# Patient Record
Sex: Female | Born: 2000 | Race: Asian | Hispanic: No | Marital: Single | Smoking: Never smoker
Health system: Southern US, Community
[De-identification: ages and names within clinical notes are randomized; demographics above are authoritative.]

## PROBLEM LIST (undated history)

## (undated) DIAGNOSIS — F4310 Post-traumatic stress disorder, unspecified: Secondary | ICD-10-CM

## (undated) DIAGNOSIS — G4700 Insomnia, unspecified: Secondary | ICD-10-CM

## (undated) HISTORY — PX: NO PAST SURGERIES: SHX2092

---
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
## 2021-07-02 ENCOUNTER — Inpatient Hospital Stay (HOSPITAL_COMMUNITY)
Admission: AD | Admit: 2021-07-02 | Discharge: 2021-07-02 | Disposition: A | Discharge status: Home / Self Care | Payer: Commercial Managed Care - HMO | Attending: Family Medicine | Admitting: Family Medicine

## 2021-07-02 ENCOUNTER — Encounter (HOSPITAL_COMMUNITY): Payer: Self-pay | Admitting: Family Medicine

## 2021-07-02 ENCOUNTER — Other Ambulatory Visit: Payer: Self-pay

## 2021-07-02 DIAGNOSIS — Z3A11 11 weeks gestation of pregnancy: Secondary | ICD-10-CM | POA: Insufficient documentation

## 2021-07-02 DIAGNOSIS — O210 Mild hyperemesis gravidarum: Secondary | ICD-10-CM | POA: Diagnosis present

## 2021-07-02 DIAGNOSIS — O219 Vomiting of pregnancy, unspecified: Secondary | ICD-10-CM

## 2021-07-02 LAB — URINALYSIS, ROUTINE W REFLEX MICROSCOPIC
Bilirubin Urine: NEGATIVE
Glucose, UA: NEGATIVE mg/dL
Hgb urine dipstick: NEGATIVE
Ketones, ur: NEGATIVE mg/dL
Nitrite: NEGATIVE
Protein, ur: NEGATIVE mg/dL
Specific Gravity, Urine: 1.015 (ref 1.005–1.030)
pH: 6 (ref 5.0–8.0)

## 2021-07-02 LAB — WET PREP, GENITAL
Sperm: NONE SEEN
Trich, Wet Prep: NONE SEEN
WBC, Wet Prep HPF POC: >=10 — AB (ref <10)
Yeast Wet Prep HPF POC: NONE SEEN

## 2021-07-02 LAB — POCT PREGNANCY, URINE: Preg Test, Ur: POSITIVE — AB

## 2021-07-02 MED ORDER — METOCLOPRAMIDE HCL 10 MG PO TABS
10.0000 mg | ORAL_TABLET | Freq: Once | ORAL | Status: AC
  Administered 2021-07-02: 10 mg via ORAL
  Filled 2021-07-02: qty 1

## 2021-07-02 MED ORDER — METOCLOPRAMIDE HCL 10 MG PO TABS: 10.0000 mg | ORAL_TABLET | Freq: Three times a day (TID) | ORAL | 0 refills | Status: AC | PRN

## 2021-07-02 NOTE — MAU Note (Signed)
Regina Sanchez is a 21 y.o. at [redacted]w[redacted]d here in MAU reporting: has been feeling nauseated and having some emesis at night ongoing since February. Is now having some headaches, none currently. Intermittent lower abdominal discomfort, none currently. No bleeding or discharge. ? ?LMP: 04/13/2021 ? ?Onset of complaint: ongoing ? ?Pain score: 0/10 ? ?Vitals:  ? 07/02/21 1731  ?BP: 123/72  ?Pulse: 70  ?Resp: 16  ?Temp: 98.5 ?F (36.9 ?C)  ?SpO2: 98%  ?   ?FHT:172 ? ?Lab orders placed from triage: ua, upt ? ?

## 2021-07-02 NOTE — Discharge Instructions (Signed)
?  [facility]  ? ? ? ? ? ?   ?[facility]  [address], [place], [place] [address]  [phone]  ?[facility]  [address], [place], [place] [address]  [phone]  ?[facility]  [address], [place], [place] [address]  [phone]  ?[facility] [place] [facility]  [address], [place], [place] [address]  [phone]  ?[facility]  [address]), [place], [place] [address]  [phone]  ?[facility]  [address], [place], [place] [address]  [phone]  ?[facility]  [address], [place], [place] [address]  [phone]  ?[facility]  [address], [place], [place] [address]  [phone]  ?[facility]  [address], [place], [place] [address]  [phone]  ?[facility]  [address], [place], [place] [address]  [phone]  ?[facility]  [address], [place], [place] [address]  [phone]  ?[facility]   [address], [place], [place] [address]  [phone]  ?[facility]  [address], [place], [place] [address]   [phone]  ?[facility]  [address], , [place] [address]  [phone]  ? ? ? ? ? ? ?Safe Medications in Pregnancy  ? ?Acne: ?Benzoyl Peroxide ?Salicylic Acid ? ?Backache/Headache: ?Tylenol: 2 regular strength every 4 hours OR ?             2 Extra strength every 6 hours ? ?Colds/Coughs/Allergies: ?Benadryl (alcohol free) 25 mg every 6 hours as needed ?Breath right strips ?Claritin ?Cepacol throat lozenges ?Chloraseptic throat spray ?Cold-Eeze- up to three times per day ?Cough drops, alcohol free ?Flonase (by prescription only) ?Guaifenesin ?Mucinex ?Robitussin DM (plain only, alcohol free) ?Saline nasal  spray/drops ?Sudafed (pseudoephedrine) & Actifed ** use only after [redacted] weeks gestation and if you do not have high blood pressure ?Tylenol ?Vicks Vaporub ?Zinc lozenges ?Zyrtec  ? ?Constipation: ?Colace ?Ducolax suppositories ?Fleet enema ?Glycerin suppositories ?Metamucil ?Milk of magnesia ?Miralax ?Senokot ?Smooth move tea ? ?Diarrhea: ?Kaopectate ?Imodium A-D ? ?*NO pepto Bismol ? ?Hemorrhoids: ?Anusol ?Anusol HC ?Preparation H ?Tucks ? ?Indigestion: ?Tums ?Maalox ?Mylanta ?Zantac  ?Pepcid ? ?Insomnia: ?Benadryl (alcohol free) 25mg every 6 hours as needed ?Tylenol PM ?Unisom, no Gelcaps ? ?Leg Cramps: ?Tums ?MagGel ? ?Nausea/Vomiting:  ?Bonine ?Dramamine ?Emetrol ?Ginger extract ?Sea bands ?Meclizine  ?Nausea medication to take during pregnancy:  ?Unisom (doxylamine succinate 25 mg tablets) Take one tablet daily at bedtime. If symptoms are not adequately controlled, the dose can be increased to a maximum recommended dose of two tablets daily (1/2 tablet in the morning, 1/2 tablet mid-afternoon and one at bedtime). ?Vitamin B6 100mg tablets. Take one tablet twice a day (up to 200 mg per day). ? ?Skin Rashes: ?Aveeno products ?Benadryl cream or 25mg every 6 hours as needed ?Calamine Lotion ?1% cortisone cream ? ?Yeast infection: ?Gyne-lotrimin 7 ?Monistat 7 ? ?Gum/tooth pain: ?Anbesol ? ?**If taking multiple medications, please check labels to avoid duplicating the same active ingredients ?**take medication as directed on the label ?** Do not exceed 4000 mg of tylenol in 24 hours ?**Do not take medications that contain aspirin or ibuprofen ? ? ? ?

## 2021-07-02 NOTE — MAU Provider Note (Signed)
?History  ?  ? ?[id number] ? ?Arrival date and time: 07/02/21 1649 ?  ? ?Chief Complaint  ?Patient presents with  ? Nausea  ? ? ? ?HPI ?Regina Sanchez is a 21 y.o. at [redacted]w[redacted]d by LMP who presents for nausea. ?Has not had care yet with this pregnancy. States she went to [facility] but was told she's too far along to start care there.  ?Reports intermittent lower abdominal cramping for several weeks. Currently denies pain. Has had intermittent nausea & vomiting. Currently nauseated and vomited last night. Has not taken anything for symptoms.  ?Denies diarrhea, dysuria, vaginal bleeding, or vaginal discharge.   ? ?OB History   ? ? Gravida  ?1  ? Para  ?   ? Term  ?   ? Preterm  ?   ? AB  ?   ? Living  ?   ?  ? ? SAB  ?   ? IAB  ?   ? Ectopic  ?   ? Multiple  ?   ? Live Births  ?   ?   ?  ?  ? ? ?Past Medical History:  ?Diagnosis Date  ? Insomnia 05/02/2015  ? PTSD (post-traumatic stress disorder) 05/02/2015  ? ? ?No past surgical history on file. ? ?Family History  ?Problem Relation Age of Onset  ? Healthy Mother   ? Healthy Father   ? ? ?Allergies  ?Allergen Reactions  ? Peanut Butter Flavor Hives  ? ? ?No current facility-administered medications on file prior to encounter.  ? ?Current Outpatient Medications on File Prior to Encounter  ?Medication Sig Dispense Refill  ? acetaminophen (TYLENOL) 500 MG tablet Take 1 tablet (500 mg total) by mouth every 6 (six) hours as needed. 30 tablet 0  ? polyethylene glycol powder (GLYCOLAX/MIRALAX) powder Take 17 g by mouth daily. 255 g 0  ? ? ? ?ROS ?Pertinent positives and negative per HPI, all others reviewed and negative ? ?Physical Exam  ? ?BP 123/72 (BP Location: Right Arm)   Pulse 70   Temp 98.5 ?F (36.9 ?C) (Oral)   Resp 16   Ht 5\' 5"  (1.651 m)   Wt 54.4 kg   LMP 04/13/2021   SpO2 98% Comment: room air  BMI 19.95 kg/m?  ? ?Patient Vitals for the past 24 hrs: ? BP Temp Temp src Pulse Resp SpO2 Height Weight  ?07/02/21 1731 123/72 98.5 ?F (36.9 ?C) Oral 70 16 98 % -- --   ?07/02/21 1724 -- -- -- -- -- -- 5\' 5"  (1.651 m) 54.4 kg  ? ? ?Physical Exam ?Vitals and nursing note reviewed.  ?Constitutional:   ?   General: She is not in acute distress. ?   Appearance: Normal appearance.  ?HENT:  ?   Head: Normocephalic and atraumatic.  ?Eyes:  ?   General: No scleral icterus. ?   Conjunctiva/sclera: Conjunctivae normal.  ?Pulmonary:  ?   Effort: Pulmonary effort is normal. No respiratory distress.  ?Abdominal:  ?   General: Abdomen is flat. There is no distension.  ?   Palpations: Abdomen is soft.  ?   Tenderness: There is no abdominal tenderness.  ?Skin: ?   General: Skin is warm and dry.  ?Neurological:  ?   Mental Status: She is alert.  ?Psychiatric:     ?   Mood and Affect: Mood normal.     ?   Behavior: Behavior normal.  ?  ? ?Labs ?Results for orders placed or performed during the hospital  encounter of 07/02/21 (from the past 24 hour(s))  ?Pregnancy, urine POC     Status: Abnormal  ? Collection Time: 07/02/21  5:05 PM  ?Result Value Ref Range  ? Preg Test, Ur POSITIVE (A) NEGATIVE  ?Urinalysis, Routine w reflex microscopic Urine, Clean Catch     Status: Abnormal  ? Collection Time: 07/02/21  5:09 PM  ?Result Value Ref Range  ? Color, Urine YELLOW YELLOW  ? APPearance HAZY (A) CLEAR  ? Specific Gravity, Urine 1.015 1.005 - 1.030  ? pH 6.0 5.0 - 8.0  ? Glucose, UA NEGATIVE NEGATIVE mg/dL  ? Hgb urine dipstick NEGATIVE NEGATIVE  ? Bilirubin Urine NEGATIVE NEGATIVE  ? Ketones, ur NEGATIVE NEGATIVE mg/dL  ? Protein, ur NEGATIVE NEGATIVE mg/dL  ? Nitrite NEGATIVE NEGATIVE  ? Leukocytes,Ua MODERATE (A) NEGATIVE  ? RBC / HPF 0-5 0 - 5 RBC/hpf  ? WBC, UA 11-20 0 - 5 WBC/hpf  ? Bacteria, UA MANY (A) NONE SEEN  ? Squamous Epithelial / LPF 6-10 0 - 5  ? Mucus PRESENT   ?Wet prep, genital     Status: Abnormal  ? Collection Time: 07/02/21  5:43 PM  ?Result Value Ref Range  ? Yeast Wet Prep HPF POC NONE SEEN NONE SEEN  ? Trich, Wet Prep NONE SEEN NONE SEEN  ? Clue Cells Wet Prep HPF POC PRESENT (A)  NONE SEEN  ? WBC, Wet Prep HPF POC >=10 (A) <10  ? Sperm NONE SEEN   ? ? ?Imaging ?No results found. ? ?MAU Course  ?Procedures ?Lab Orders    ?     Wet prep, genital    ?     Culture, OB Urine    ?     Urinalysis, Routine w reflex microscopic Urine, Clean Catch    ?     Pregnancy, urine POC    ? ?Meds ordered this encounter  ?Medications  ? metoCLOPramide (REGLAN) tablet 10 mg  ? metoCLOPramide (REGLAN) 10 MG tablet  ?  Sig: Take 1 tablet (10 mg total) by mouth every 8 (eight) hours as needed for nausea.  ?  Dispense:  30 tablet  ?  Refill:  0  ?  Order Specific Question:   Supervising Provider  ?  Answer:   Levie Heritage [[id number]]  ? ?Imaging Orders  ?No imaging studies ordered today  ? ? ?MDM ?FHT present via doppler ?Patient reports nausea - no vomiting. Given reglan in MAU & will send home with prescription.  ?Currently no pain.  ? ?Wet prep positive for clue cells. Patient denies vaginal discharge. Does not meet amsel criteria for BV.  ?Assessment and Plan  ? ?1. Nausea and vomiting during pregnancy prior to [redacted] weeks gestation   ?2. [redacted] weeks gestation of pregnancy   ? ?-Rx reglan ?-Start prenatal vitamins ?-Given list of OBs - start care asap ?-Reviewed reasons to return to MAU ?-GC/CT & urine culture pending ? ? ?Judeth Horn, NP ?07/02/21 ?6:15 PM ? ? ?

## 2021-07-03 LAB — GC/CHLAMYDIA PROBE AMP (~~LOC~~) NOT AT ARMC
Chlamydia: NEGATIVE
Comment: NEGATIVE
Comment: NORMAL
Neisseria Gonorrhea: NEGATIVE

## 2021-07-05 LAB — CULTURE, OB URINE: Culture: >=100000 — AB

## 2021-07-06 ENCOUNTER — Telehealth: Payer: Self-pay | Admitting: Obstetrics and Gynecology

## 2021-07-06 MED ORDER — CEFADROXIL 500 MG PO CAPS: 500.0000 mg | ORAL_CAPSULE | Freq: Two times a day (BID) | ORAL | 0 refills | Status: AC

## 2021-07-06 NOTE — Telephone Encounter (Signed)
+   urine culture ?Discussed with patient over the phone. ?RX: Duricef  ? ? ?Duane Lope, NP ?07/06/2021 ?5:39 PM ? ?

## 2021-08-21 ENCOUNTER — Inpatient Hospital Stay (HOSPITAL_COMMUNITY)
Admission: AD | Admit: 2021-08-21 | Discharge: 2021-08-21 | Disposition: A | Discharge status: Home / Self Care | Payer: Commercial Managed Care - HMO | Attending: Obstetrics and Gynecology | Admitting: Obstetrics and Gynecology

## 2021-08-21 ENCOUNTER — Encounter (HOSPITAL_COMMUNITY): Payer: Self-pay | Admitting: Obstetrics and Gynecology

## 2021-08-21 ENCOUNTER — Inpatient Hospital Stay (HOSPITAL_BASED_OUTPATIENT_CLINIC_OR_DEPARTMENT_OTHER): Payer: Commercial Managed Care - HMO

## 2021-08-21 DIAGNOSIS — Z3A18 18 weeks gestation of pregnancy: Secondary | ICD-10-CM | POA: Diagnosis not present

## 2021-08-21 DIAGNOSIS — R109 Unspecified abdominal pain: Secondary | ICD-10-CM

## 2021-08-21 DIAGNOSIS — Z711 Person with feared health complaint in whom no diagnosis is made: Secondary | ICD-10-CM

## 2021-08-21 DIAGNOSIS — O4692 Antepartum hemorrhage, unspecified, second trimester: Secondary | ICD-10-CM

## 2021-08-21 DIAGNOSIS — F322 Major depressive disorder, single episode, severe without psychotic features: Secondary | ICD-10-CM | POA: Insufficient documentation

## 2021-08-21 DIAGNOSIS — F4310 Post-traumatic stress disorder, unspecified: Secondary | ICD-10-CM | POA: Diagnosis not present

## 2021-08-21 DIAGNOSIS — O99342 Other mental disorders complicating pregnancy, second trimester: Secondary | ICD-10-CM | POA: Insufficient documentation

## 2021-08-21 DIAGNOSIS — O26892 Other specified pregnancy related conditions, second trimester: Secondary | ICD-10-CM

## 2021-08-21 DIAGNOSIS — F938 Other childhood emotional disorders: Secondary | ICD-10-CM

## 2021-08-21 DIAGNOSIS — F418 Other specified anxiety disorders: Secondary | ICD-10-CM | POA: Insufficient documentation

## 2021-08-21 LAB — WET PREP, GENITAL
Sperm: NONE SEEN
Trich, Wet Prep: NONE SEEN
WBC, Wet Prep HPF POC: <10 (ref <10)
Yeast Wet Prep HPF POC: NONE SEEN

## 2021-08-21 LAB — URINALYSIS, ROUTINE W REFLEX MICROSCOPIC
Bilirubin Urine: NEGATIVE
Glucose, UA: NEGATIVE mg/dL
Hgb urine dipstick: NEGATIVE
Ketones, ur: NEGATIVE mg/dL
Nitrite: NEGATIVE
Protein, ur: NEGATIVE mg/dL
Specific Gravity, Urine: 1.012 (ref 1.005–1.030)
pH: 7 (ref 5.0–8.0)

## 2021-08-21 LAB — CBC
HCT: 35.7 % — ABNORMAL LOW (ref 36.0–46.0)
Hemoglobin: 11.7 g/dL — ABNORMAL LOW (ref 12.0–15.0)
MCH: 28.7 pg (ref 26.0–34.0)
MCHC: 32.8 g/dL (ref 30.0–36.0)
MCV: 87.5 fL (ref 80.0–100.0)
Platelets: 191 10*3/uL (ref 150–400)
RBC: 4.08 MIL/uL (ref 3.87–5.11)
RDW: 13.3 % (ref 11.5–15.5)
WBC: 9.1 10*3/uL (ref 4.0–10.5)
nRBC: 0 % (ref 0.0–0.2)

## 2021-08-21 LAB — ABO/RH: ABO/RH(D): A POS

## 2021-08-21 NOTE — MAU Provider Note (Signed)
  History     CSN: [id number]  Arrival date and time: 08/21/21 1610   Event Date/Time   First Provider Initiated Contact with Patient 08/21/21 1646      Chief Complaint  Patient presents with  . Vaginal Bleeding   Regina Sanchez, a  21 y.o. G1P0 at [redacted]w[redacted]d presents to MAU with vaginal bleeding and period cramping that started 3 days ago. Intercourse last Friday. Dark red. Patient states "it was a lot". Still experience cramping but no bleeding. Patient states multiple interactions and noticed bleeding started on 4th interaction. 3/10.         {GYN/OB [id number]  Past Medical History:  Diagnosis Date  . Insomnia 05/02/2015  . PTSD (post-traumatic stress disorder) 05/02/2015    Past Surgical History:  Procedure Laterality Date  . NO PAST SURGERIES      Family History  Problem Relation Age of Onset  . Healthy Mother   . Healthy Father     Social History   Tobacco Use  . Smoking status: Never  . Smokeless tobacco: Never  Substance Use Topics  . Alcohol use: No  . Drug use: Not Currently    Types: Marijuana    Comment: States she tried it once last week    Allergies:  Allergies  Allergen Reactions  . Peanut Butter Flavor Hives    Medications Prior to Admission  Medication Sig Dispense Refill Last Dose  . Prenatal Vit-Fe Fumarate-FA (MULTIVITAMIN-PRENATAL) 27-0.8 MG TABS tablet Take 1 tablet by mouth daily at 12 noon.   08/21/2021  . acetaminophen (TYLENOL) 500 MG tablet Take 1 tablet (500 mg total) by mouth every 6 (six) hours as needed. 30 tablet 0   . metoCLOPramide (REGLAN) 10 MG tablet Take 1 tablet (10 mg total) by mouth every 8 (eight) hours as needed for nausea. 30 tablet 0   . polyethylene glycol powder (GLYCOLAX/MIRALAX) powder Take 17 g by mouth daily. 255 g 0     Review of Systems Physical Exam   Blood pressure (!) 124/47, pulse 85, temperature 98 F (36.7 C), resp. rate 15, last menstrual period 04/13/2021, SpO2 100 %.  Physical Exam  MAU  Course  Procedures  MDM ***  Assessment and Plan  ***  Jacquiline Doe 08/21/2021, 4:46 PM

## 2021-08-21 NOTE — MAU Note (Signed)
.  Regina Sanchez is a 21 y.o. at [redacted]w[redacted]d here in MAU reporting: vaginal bleeding and lower abd cramping. Vaginal bleeding started 3 days ago and stopped after one day. Bleeding started after intercourse. Cramping started at the same time as the bleeding but the cramping has continued.  Onset of complaint: 3 days ago Pain score: 2/10 Vitals:   08/21/21 1638  BP: (!) 124/47  Pulse: 85  Resp: 15  Temp: 98 F (36.7 C)  SpO2: 100%     FHT:147 Lab orders placed from triage:

## 2021-08-22 ENCOUNTER — Telehealth: Payer: Self-pay | Admitting: Family Medicine

## 2021-08-22 LAB — CULTURE, OB URINE

## 2021-08-22 LAB — GC/CHLAMYDIA PROBE AMP (~~LOC~~) NOT AT ARMC
Chlamydia: POSITIVE — AB
Comment: NEGATIVE
Comment: NORMAL
Neisseria Gonorrhea: NEGATIVE

## 2021-08-22 NOTE — Telephone Encounter (Signed)
Called patient to schedule now ob appointments, there was no answer to the phone call so a voicemail was left and a letter was mailed.

## 2021-08-23 ENCOUNTER — Telehealth: Payer: Commercial Managed Care - HMO

## 2021-08-23 ENCOUNTER — Telehealth (HOSPITAL_COMMUNITY): Payer: Self-pay

## 2021-08-23 MED ORDER — AZITHROMYCIN 500 MG PO TABS: 1000.0000 mg | ORAL_TABLET | Freq: Once | ORAL | 0 refills | Status: AC

## 2021-08-23 NOTE — BH Specialist Note (Signed)
Integrated Behavioral Health via Telemedicine Visit  09/06/2021 Regina Sanchez [id number]  Number of Integrated Behavioral Health Clinician visits: 1- Initial Visit  Session Start time: [phone]   Session End time: 0935  Total time in minutes: 48   Referring Provider: Sandra Cockayne, CNM Patient/Family location: Home [facility] Provider location: [facility]  All persons participating in visit: Patient Regina Sanchez and [facility] Ivon Oelkers   Types of Service: Individual psychotherapy and Video visit  I connected with Charlotte Ralph and/or Rindi Trine's  n/a  via  Telephone or Video Enabled Telemedicine Application  (Video is Caregility application) and verified that I am speaking with the correct person using two identifiers. Discussed confidentiality: Yes   I discussed the limitations of telemedicine and the availability of in person appointments.  Discussed there is a possibility of technology failure and discussed alternative modes of communication if that failure occurs.  I discussed that engaging in this telemedicine visit, they consent to the provision of behavioral healthcare and the services will be billed under their insurance.  Patient and/or legal guardian expressed understanding and consented to Telemedicine visit: Yes   Presenting Concerns: Patient and/or family reports the following symptoms/concerns: Considering move to [place] (into boyfriend's home) for more support before baby is born; pt family is not supportive; currently lives with a friend and friend's family.  Duration of problem: Current pregnancy; Severity of problem: mild  Patient and/or Family's Strengths/Protective Factors: Social connections and Sense of purpose  Goals Addressed: Patient will:  Reduce symptoms of: anxiety and stress   Increase knowledge and/or ability of: healthy habits   Demonstrate ability to: Increase healthy adjustment  to current life circumstances  Progress towards Goals: Ongoing  Interventions: Interventions utilized:  Psychoeducation and/or Health Education, Link to [employer], and Supportive Reflection Standardized Assessments completed: GAD-7 and PHQ 9  Patient and/or Family Response: Patient agrees with treatment plan.   Assessment: Patient currently experiencing Other specified counseling and Psychosocial stress.   Patient may benefit from psychoeducation and brief therapeutic interventions regarding coping with symptoms of current life stress .  Plan: Follow up with behavioral health clinician on : Two weeks Behavioral recommendations:  -Continue taking prenatal vitamin daily, as prescribed -Contact ob/gyn practices in [place] boyfriend lives: are they taking new patients? How many weeks pregnant can you be as a new patient? Which hospital will you be delivering if you move? -At upcoming appt on 09/20/21, ask at front desk about options for transferring medical records to new location -Accept referral to [facility] -Read through information on After Visit Summary; use as needed Referral(s): Integrated Art gallery manager (In Clinic) and [employer]:  new mom information/resources  I discussed the assessment and treatment plan with the patient and/or parent/guardian. They were provided an opportunity to ask questions and all were answered. They agreed with the plan and demonstrated an understanding of the instructions.   They were advised to call back or seek an in-person evaluation if the symptoms worsen or if the condition fails to improve as anticipated.  Rae Lips, LCSW    09/06/2021    8:59 AM  Depression screen PHQ 2/9  Decreased Interest 1  Down, Depressed, Hopeless 0  PHQ - 2 Score 1  Altered sleeping 2  Tired, decreased energy 1  Change in appetite 3  Feeling bad or failure about yourself  0  Trouble concentrating 0  Moving slowly or  fidgety/restless 0  Suicidal thoughts 0  PHQ-9 Score 7      09/06/2021    9:04 AM  GAD 7 : Generalized Anxiety Score  Nervous, Anxious, on Edge 0  Control/stop worrying 0  Worry too much - different things 0  Trouble relaxing 1  Restless 0  Easily annoyed or irritable 1  Afraid - awful might happen 0  Total GAD 7 Score 2

## 2021-08-29 ENCOUNTER — Telehealth (INDEPENDENT_AMBULATORY_CARE_PROVIDER_SITE_OTHER): Payer: Commercial Managed Care - HMO

## 2021-08-29 DIAGNOSIS — Z3492 Encounter for supervision of normal pregnancy, unspecified, second trimester: Secondary | ICD-10-CM

## 2021-08-29 DIAGNOSIS — Z3400 Encounter for supervision of normal first pregnancy, unspecified trimester: Secondary | ICD-10-CM

## 2021-08-29 DIAGNOSIS — Z3A00 Weeks of gestation of pregnancy not specified: Secondary | ICD-10-CM

## 2021-08-29 DIAGNOSIS — Z3490 Encounter for supervision of normal pregnancy, unspecified, unspecified trimester: Secondary | ICD-10-CM | POA: Insufficient documentation

## 2021-08-29 NOTE — Progress Notes (Signed)
New OB Intake  I connected with  Regina Sanchez on 08/29/21 at 11:15 AM EDT by MyChart Video Visit and verified that I am speaking with the correct person using two identifiers. Nurse is located at [facility] and pt is located at home.  I discussed the limitations, risks, security and privacy concerns of performing an evaluation and management service by telephone and the availability of in person appointments. I also discussed with the patient that there may be a patient responsible charge related to this service. The patient expressed understanding and agreed to proceed.  I explained I am completing New OB Intake today. We discussed her EDD of 01/18/2022 that is based on LMP of 04/13/2021. Pt is G1/P0. I reviewed her allergies, medications, Medical/Surgical/OB history, and appropriate screenings. I informed her of [facility] services. Based on history, this is a/an  pregnancy uncomplicated .   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   MyChart/Babyscripts MyChart access verified. I explained pt will have some visits in office and some virtually. Babyscripts instructions given and order placed. Patient verifies receipt of registration text/e-mail. Account successfully created and app downloaded.  Blood Pressure Cuff  Patient has private insurance; instructed to purchase blood pressure cuff and bring to first prenatal appt. Explained after first prenatal appt pt will check weekly and document in Babyscripts.  Weight scale: Patient does not  have weight scale. Weight scale ordered for patient to pick up from [employer].   Anatomy US Explained first scheduled [place] will be around 19  weeks. Anatomy US scheduled for 09/20/2021 at 09:15. Pt notified to arrive at 9:30 AM. Scheduled AFP lab only appointment if CenteringPregnancy pt for same day as anatomy US.   Labs Discussed Avelina Laine genetic screening with patient. Would like both Panorama and Horizon drawn at new OB visit.Also if interested in genetic testing, tell patient she will need AFP 15-21 weeks to complete genetic testing .Routine prenatal labs needed.  Covid Vaccine Patient has not covid vaccine.   Is patient a CenteringPregnancy candidate?  Declined Declined due to Support Person Concern Is patient a Mom+Baby Combined Care candidate?  Declined      Is patient interested in [place]?  No   Informed patient of [facility] website  and placed link in her AVS.   Social Determinants of Health Food Insecurity: Patient denies food insecurity. WIC Referral: Patient is interested in referral to [facility].  Transportation: Patient denies transportation needs. Childcare: Discussed no children allowed at ultrasound appointments. Offered childcare services; patient declines childcare services at this time.  Send link to Pregnancy Navigators   Placed OB Box on problem list and updated  First visit review I reviewed new OB appt with pt. I explained she will have a pelvic exam, ob bloodwork with genetic screening. Explained pt will be seen by Chrystie Nose CNM at first visit; encounter routed to appropriate provider. Explained that patient will be seen by pregnancy navigator following visit with provider. [facility] information placed in AVS.   Vidal Schwalbe, CMA 08/29/2021  11:17 AM

## 2021-09-06 ENCOUNTER — Ambulatory Visit (INDEPENDENT_AMBULATORY_CARE_PROVIDER_SITE_OTHER): Payer: Commercial Managed Care - HMO | Admitting: Clinical

## 2021-09-06 DIAGNOSIS — Z7189 Other specified counseling: Secondary | ICD-10-CM

## 2021-09-06 DIAGNOSIS — Z658 Other specified problems related to psychosocial circumstances: Secondary | ICD-10-CM | POA: Diagnosis not present

## 2021-09-06 NOTE — Patient Instructions (Addendum)
Center for [facility] [place], [place] [address] [phone] (main office) [phone] [facility] office)  New Parent Support Groups www.postpartum.net www.conehealthybaby.com (this site also has childbirth class options, etc.)    BRAINSTORMING  Develop a Plan Goals: Provide a way to start conversation about your new life with a baby Assist parents in recognizing and using resources within their reach Help pave the way before birth for an easier period of transition afterwards.  Make a list of the following information to keep in a central location: Full name of Mom and Partner: _____________________________________________ 45 full name and Date of Birth: ___________________________________________ Home Address: ___________________________________________________________ ________________________________________________________________________ Home Phone: ____________________________________________________________ Parents' cell numbers: _____________________________________________________ ________________________________________________________________________ Name and contact info for OB: ______________________________________________ Name and contact info for Pediatrician:________________________________________ Contact info for Lactation Consultants: ________________________________________  REST and SLEEP *You each need at least 4-5 hours of uninterrupted sleep every day. Write specific names and contact information.* How are you going to rest in the postpartum period? While partner's home? When partner returns to work? When you both return to work? Where will your baby sleep? Who is available to help during the day? Evening? Night? Who could move in for a period to help support you? What are some ideas to help you get enough  sleep? __________________________________________________________________________________________________________________________________________________________________________________________________________________________________________ NUTRITIOUS FOOD AND DRINK *Plan for meals before your baby is born so you can have healthy food to eat during the immediate postpartum period.* Who will look after breakfast? Lunch? Dinner? List names and contact information. Brainstorm quick, healthy ideas for each meal. What can you do before baby is born to prepare meals for the postpartum period? How can others help you with meals? Which grocery stores provide online shopping and delivery? Which restaurants offer take-out or delivery options? ______________________________________________________________________________________________________________________________________________________________________________________________________________________________________________________________________________________________________________________________________________________________________________________________________  CARE FOR MOM *It's important that mom is cared for and pampered in the postpartum period. Remember, the most important ways new mothers need care are: sleep, nutrition, gentle exercise, and time off.* Who can come take care of mom during this period? Make a list of people with their contact information. List some activities that make you feel cared for, rested, and energized? Who can make sure you have opportunities to do these things? Does mom have a space of her very own within your home that's just for her? Make a "[facility]" where she can be comfortable, rest, and renew herself  daily. ______________________________________________________________________________________________________________________________________________________________________________________________________________________________________________________________________________________________________________________________________________________________________________________________________    CARE FOR AND FEEDING BABY *Knowledgeable and encouraging people will offer the best support with regard to feeding your baby.* Educate yourself and choose the best feeding option for your baby. Make a list of people who will guide, support, and be a resource for you as your care for and feed your baby. (Friends that have breastfed or are currently breastfeeding, lactation consultants, breastfeeding support groups, etc.) Consider a postpartum doula. (These websites can give you information: dona.org & BuyingShow.es) Seek out local breastfeeding resources like the breastfeeding support group at [employer] or [employer]. ______________________________________________________________________________________________________________________________________________________________________________________________________________________________________________________________________________________________________________________________________________________________________________________________________  Verner Chol AND ERRANDS Who can help with a thorough cleaning before baby is born? Make a list of people who will help with housekeeping and chores, like laundry, light cleaning, dishes, bathrooms, etc. Who can run some errands for you? What can you do to make sure you are stocked with basic supplies before baby is born? Who is going to do the  shopping? ______________________________________________________________________________________________________________________________________________________________________________________________________________________________________________________________________________________________________________________________________________________________________________________________________     Family Adjustment *Nurture yourselves.it helps parents be more loving and allows for  better bonding with their child.* What sorts of things do you and partner enjoy doing together? Which activities help you to connect and strengthen your relationship? Make a list of those things. Make a list of people whom you trust to care for your baby so you can have some time together as a couple. What types of things help partner feel connected to Mom? Make a list. What needs will partner have in order to bond with baby? Other children? Who will care for them when you go into labor and while you are in the hospital? Think about what the needs of your older children might be. Who can help you meet those needs? In what ways are you helping them prepare for bringing baby home? List some specific strategies you have for family adjustment. _______________________________________________________________________________________________________________________________________________________________________________________________________________________________________________________________________________________________________________________________________________  SUPPORT *Someone who can empathize with experiences normalizes your problems and makes them more bearable.* Make a list of other friends, neighbors, and/or co-workers you know with infants (and small children, if applicable) with whom you can connect. Make a list of local or online support groups, mom groups, etc. in which you can be  involved. ______________________________________________________________________________________________________________________________________________________________________________________________________________________________________________________________________________________________________________________________________________________________________________________________________  Childcare Plans Investigate and plan for childcare if mom is returning to work. Talk about mom's concerns about her transition back to work. Talk about partner's concerns regarding this transition.  Mental Health *Your mental health is one of the highest priorities for a pregnant or postpartum mom.* 1 in 5 women experience anxiety and/or depression from the time of conception through the first year after birth. Postpartum Mood Disorders are the #1 complication of pregnancy and childbirth and the suffering experienced by these mothers is not necessary! These illnesses are temporary and respond well to treatment, which often includes self-care, social support, talk therapy, and medication when needed. Women experiencing anxiety and depression often say things like: "I'm supposed to be happy.why do I feel so sad?", "Why can't I snap out of it?", "I'm having thoughts that scare me." There is no need to be embarrassed if you are feeling these symptoms: Overwhelmed, anxious, angry, sad, guilty, irritable, hopeless, exhausted but can't sleep You are NOT alone. You are NOT to blame. With help, you WILL be well. Where can I find help? Medical professionals such as your OB, midwife, gynecologist, family practitioner, primary care provider, pediatrician, or mental health providers; [facility] support groups: Feelings After Birth, Breastfeeding Support Group, Baby and Me Group, and Fit 4 Two exercise classes. You have permission to ask for help. It will confirm your feelings, validate your experiences,  share/learn coping strategies, and gain support and encouragement as you heal. You are important! BRAINSTORM Make a list of local resources, including resources for mom and for partner. Identify support groups. Identify people to call late at night - include names and contact info. Talk with partner about perinatal mood and anxiety disorders. Talk with your OB, midwife, and doula about baby blues and about perinatal mood and anxiety disorders. Talk with your pediatrician about perinatal mood and anxiety disorders.   Support & Sanity Savers   What do you really need?  Basics In preparing for a new baby, many expectant parents spend hours shopping for baby clothes, decorating the nursery, and deciding which car seat to buy. Yet most don't think much about what the reality of parenting a newborn will be like, and what they need to make it through that. So, here is the advice of experienced parents. We know you'll read this, and think "they're exaggerating, I  don't really need that." Just trust [place] on these, OK? Plan for all of this, and if it turns out you don't need it, come back and teach [place] how you did it!  Must-Haves (Once baby's survival needs are met, make sure you attend to your own survival needs!) Sleep An average newborn sleeps 16-18 hours per day, over 6-7 sleep periods, rarely more than three hours at a time. It is normal and healthy for a newborn to wake throughout the night... but really hard on parents!! Naps. Prioritize sleep above any responsibilities like: cleaning house, visiting friends, running errands, etc.  Sleep whenever baby sleeps. If you can't nap, at least have restful times when baby eats. The more rest you get, the more patient you will be, the more emotionally stable, and better at solving problems.  Food You may not have realized it would be difficult to eat when you have a newborn. Yet, when we talk to countless new parents, they say things like "it may be 2:00 pm  when I realize I haven't had breakfast yet." Or "every time we sit down to dinner, baby needs to eat, and my food gets cold, so I don't bother to eat it." Finger food. Before your baby is born, stock up with one months' worth of food that: 1) you can eat with one hand while holding a baby, 2) doesn't need to be prepped, 3) is good hot or cold, 4) doesn't spoil when left out for a few hours, and 5) you like to eat. Think about: nuts, dried fruit, Clif bars, pretzels, jerky, gogurt, baby carrots, apples, bananas, crackers, cheez-n-crackers, string cheese, hot pockets or frozen burritos to microwave, garden burgers and breakfast pastries to put in the toaster, yogurt drinks, etc. Restaurant Menus. Make lists of your favorite restaurants & menu items. When family/friends want to help, you can give specific information without much thought. They can either bring you the food or send gift cards for just the right meals. Freezer Meals.  Take some time to make a few meals to put in the freezer ahead of time.  Easy to freeze meals can be anything such as soup, lasagna, chicken pie, or spaghetti sauce. Set up a Meal Schedule.  Ask friends and family to sign up to bring you meals during the first few weeks of being home. (It can be passed around at baby showers!) You have no idea how helpful this will be until you are in the throes of parenting.  https://hamilton-woodard.com/ is a great website to check out. Emotional Support Know who to call when you're stressed out. Parenting a newborn is very challenging work. There are times when it totally overwhelms your normal coping abilities. EVERY NEW PARENT NEEDS TO HAVE A PLAN FOR WHO TO CALL WHEN THEY JUST CAN'T COPE ANY MORE. (And it has to be someone other than the baby's other parent!) Before your baby is born, come up with at least one person you can call for support - write their phone number down and post it on the refrigerator. Anxiety & Sadness. Baby blues are normal after  pregnancy; however, there are more severe types of anxiety & sadness which can occur and should not be ignored.  They are always treatable, but you have to take the first step by reaching out for help. [facility] offers a "Mom Talk" group which meets every Tuesday from 10 am - 11 am.  This group is for new moms who need support and connection after  their babies are born.  Call [phone].  Really, Really Helpful (Plan for them! Make sure these happen often!!) Physical Support with Taking Care of Yourselves Asking friends and family. Before your baby is born, set up a schedule of people who can come and visit and help out (or ask a friend to schedule for you). Any time someone says "let me know what I can do to help," sign them up for a day. When they get there, their job is not to take care of the baby (that's your job and your joy). Their job is to take care of you!  Postpartum doulas. If you don't have anyone you can call on for support, look into postpartum doulas:  professionals at helping parents with caring for baby, caring for themselves, getting breastfeeding started, and helping with household tasks. www.padanc.org is a helpful website for learning about doulas in our area. Peer Support / Parent Groups Why: One of the greatest ideas for new parents is to be around other new parents. Parent groups give you a chance to share and listen to others who are going through the same season of life, get a sense of what is normal infant development by watching several babies learn and grow, share your stories of triumph and struggles with empathetic ears, and forgive your own mistakes when you realize all parents are learning by trial and error. Where to find: There are many places you can meet other new parents throughout our community.  [facility] offers the following classes for new moms and their little ones:  Baby and Me (Birth to [place]) and Breastfeeding Support Group. Go to  www.conehealthybaby.com or call [phone] for more information. Time for your Relationship It's easy to get so caught up in meeting baby's immediate needs that it's hard to find time to connect with your partner, and meet the needs of your relationship. It's also easy to forget what "quality time with your partner" actually looks like. If you take your baby on a date, you'd be amazed how much of your couple time is spent feeding the baby, diapering the baby, admiring the baby, and talking about the baby. Dating: Try to take time for just the two of you. Babysitter tip: Sometimes when moms are breastfeeding a newborn, they find it hard to figure out how to schedule outings around baby's unpredictable feeding schedules. Have the babysitter come for a three hour period. When she comes over, if baby has just eaten, you can leave right away, and come back in two hours. If baby hasn't fed recently, you start the date at home. Once baby gets hungry and gets a good feeding in, you can head out for the rest of your date time. Date Nights at Home: If you can't get out, at least set aside one evening a week to prioritize your relationship: whenever baby dozes off or doesn't have any immediate needs, spend a little time focusing on each other. Potential conflicts: The main relationship conflicts that come up for new parents are: issues related to sexuality, financial stresses, a feeling of an unfair division of household tasks, and conflicts in parenting styles. The more you can work on these issues before baby arrives, the better!  Fun and Frills (Don't forget these. and don't feel guilty for indulging in them!) Everyone has something in life that is a fun little treat that they do just for themselves. It may be: reading the morning paper, or going for a daily jog, or having coffee  with a friend once a week, or going to a movie on Friday nights, or fine chocolates, or bubble baths, or curling up with a good  book. Unless you do fun things for yourself every now and then, it's hard to have the energy for fun with your baby. Whatever your "special" treats are, make sure you find a way to continue to indulge in them after your baby is born. These special moments can recharge you, and allow you to return to baby with a new joy   PERINATAL MOOD DISORDERS: [place]   _________________________________________Emergency and Crisis Resources If you are an imminent risk to self or others, are experiencing intense personal distress, and/or have noticed significant changes in activities of daily living, call:  [place]: [phone]  [address], [place], [place], [address] Mobile Crisis: [place]: [phone] Or visit the following crisis centers: Local Emergency Departments [facility]: [address] [place]. Hours: 8:30AM-5PM. Insurance Accepted: Medicaid, Medicare, and Uninsured.  [facility]:  [address] [place]  Mon-Friday 8am-3pm, [phone]                                                                                  ___________ Non-Crisis Resources To identify specific providers that are covered by your insurance, contact your insurance company or local agencies:  [place]: [phone] [facility] [employer]: [place]: [phone] Postpartum Support International- Warm-line: (402)621-2366                                                      __Outpatient Therapy and Medication Management   Providers:  [facility]: [phone] Hours: 9AM-5PM  Insurance Accepted: Alben Spittle, Shane Crutch, [place], [place] Total Access Care [facility]): [phone] Hours: 8AM-5:30PM  nsurance Accepted: All insurances EXCEPT AARP, [place],  [place], and [place]: [phone] Hours: 8AM-8PM Insurance Accepted: Cristal Ford, Freddrick March, [place], Medicare, [phone] [facility]: [phone] Hours: 8:30AM-7PM Insurance Accepted: Cristal Ford, Medicaid, Medicare, Tricare, [employer] Counseling:  [place] Accepted:  Holland Falling, Lorella Nimrod, [employer], [place]: [phone] Hours: 9AM-5:30PM Insurance Accepted: Alben Spittle, Charlotte Crumb, and Medicaid, Medicare, [facility]:  [phone] Hours: 9am-5pm Insurance Accepted: BCBS; they do not accept Medicaid/Medicare [facility] [place]: [phone] Hours: 9am-9pm Insurance Accepted: All major insurance including Medicaid and Medicare [facility]: [phone] Hours: [place] Accepted: All insurances EXCEPT Medicaid and Medicare. [facility]: [phone]   ____________  Parenting [place]: [phone] [place]:  [place]: (support for children in the NICU and/or with special needs), [phone]   ___________                                                                 Mental Health Support Groups Mental Health Association: [phone]    _____________                                                                                  Online Resources Postpartum Support International: http://jones-berg.com/  800-944-4PPD 2Moms Supporting Moms:  www.momssupportingmoms.net

## 2021-09-10 NOTE — BH Specialist Note (Deleted)
Integrated Behavioral Health via Telemedicine Visit  09/10/2021 Dayla Gasca [id number]  Number of Integrated Behavioral Health Clinician visits: 1- Initial Visit  Session Start time: [phone]   Session End time: 0935  Total time in minutes: 48   Referring Provider: *** Patient/Family location: [facility] Provider location: *** All persons participating in visit: *** Types of Service: {CHL AMB TYPE OF SERVICE:[phone]}  I connected with Evy Surita and/or Omesha Calk's {family members:20773} via  Telephone or Video Enabled Telemedicine Application  (Video is Caregility application) and verified that I am speaking with the correct person using two identifiers. Discussed confidentiality: {YES/NO:21197}  I discussed the limitations of telemedicine and the availability of in person appointments.  Discussed there is a possibility of technology failure and discussed alternative modes of communication if that failure occurs.  I discussed that engaging in this telemedicine visit, they consent to the provision of behavioral healthcare and the services will be billed under their insurance.  Patient and/or legal guardian expressed understanding and consented to Telemedicine visit: {YES/NO:21197}  Presenting Concerns: Patient and/or family reports the following symptoms/concerns: *** Duration of problem: ***; Severity of problem: {Mild/Moderate/Severe:20260}  Patient and/or Family's Strengths/Protective Factors: {CHL AMB BH PROTECTIVE FACTORS:[id number]}  Goals Addressed: Patient will:  Reduce symptoms of: {IBH Symptoms:21014056}   Increase knowledge and/or ability of: {IBH Patient Tools:21014057}   Demonstrate ability to: {IBH Goals:21014053}  Progress towards Goals: {CHL AMB BH PROGRESS TOWARDS GOALS:[phone]}  Interventions: Interventions utilized:  {IBH Interventions:21014054} Standardized Assessments completed: {IBH Screening Tools:21014051}  Patient and/or  Family Response: ***  Assessment: Patient currently experiencing ***.   Patient may benefit from ***.  Plan: Follow up with behavioral health clinician on : *** Behavioral recommendations: *** Referral(s): {IBH Referrals:21014055}  I discussed the assessment and treatment plan with the patient and/or parent/guardian. They were provided an opportunity to ask questions and all were answered. They agreed with the plan and demonstrated an understanding of the instructions.   They were advised to call back or seek an in-person evaluation if the symptoms worsen or if the condition fails to improve as anticipated.  Valetta Close Khalia Gong, LCSW

## 2021-09-20 ENCOUNTER — Encounter: Payer: Commercial Managed Care - HMO | Admitting: Student

## 2021-09-20 ENCOUNTER — Telehealth: Payer: Self-pay | Admitting: Clinical

## 2021-09-20 ENCOUNTER — Ambulatory Visit: Payer: Commercial Managed Care - HMO | Attending: Student

## 2021-09-20 ENCOUNTER — Other Ambulatory Visit: Payer: Self-pay | Admitting: *Deleted

## 2021-09-20 ENCOUNTER — Other Ambulatory Visit: Payer: Self-pay | Admitting: Student

## 2021-09-20 DIAGNOSIS — Z3A22 22 weeks gestation of pregnancy: Secondary | ICD-10-CM | POA: Insufficient documentation

## 2021-09-20 DIAGNOSIS — Z3492 Encounter for supervision of normal pregnancy, unspecified, second trimester: Secondary | ICD-10-CM

## 2021-09-20 DIAGNOSIS — O0932 Supervision of pregnancy with insufficient antenatal care, second trimester: Secondary | ICD-10-CM

## 2021-09-20 DIAGNOSIS — Z363 Encounter for antenatal screening for malformations: Secondary | ICD-10-CM | POA: Insufficient documentation

## 2021-09-20 DIAGNOSIS — O358XX0 Maternal care for other (suspected) fetal abnormality and damage, not applicable or unspecified: Secondary | ICD-10-CM | POA: Insufficient documentation

## 2021-09-20 NOTE — Telephone Encounter (Signed)
Attempt to reschedule appointment on July 10; Left HIPPA-compliant message (at [phone]; other two numbers were unreachable) to call back Asher Muir from [employer] for [employer] at [facility] at  [phone] [facility] office);Left MyChart message for pt.

## 2021-09-26 NOTE — BH Specialist Note (Signed)
Integrated Behavioral Health via Telemedicine Visit  09/26/2021 Regina Sanchez [id number]  Number of Integrated Behavioral Health Clinician visits: 1- Initial Visit  Session Start time: [phone]   Session End time: 0935  Total time in minutes: 48   Referring Provider: Sandra Cockayne, CNM Patient/Family location: Home  [facility] Provider location: [facility]  All persons participating in visit: Patient Regina Sanchez and Regina [facility] Regina Sanchez   Types of Service: Individual psychotherapy and Video visit  I connected with Regina Sanchez and/or Regina Sanchez's  n/a  via  Telephone or Video Enabled Telemedicine Application  (Video is Caregility application) and verified that I am speaking with the correct person using two identifiers. Discussed confidentiality: Yes   I discussed the limitations of telemedicine and the availability of in person appointments.  Discussed there is a possibility of technology failure and discussed alternative modes of communication if that failure occurs.  I discussed that engaging in this telemedicine visit, they consent to the provision of behavioral healthcare and the services will be billed under their insurance.  Patient and/or legal guardian expressed understanding and consented to Telemedicine visit: Yes   Presenting Concerns: Patient and/or family reports the following symptoms/concerns: Anxious about childbirth, pain management; transportation issues, difficult time accessing MyChart;conflict with mother; looking forward to FOB moving to [place] in August after deciding to stay in state for remainder of perinatal time.  Duration of problem: Current pregnancy; Severity of problem: moderate  Patient and/or Family's Strengths/Protective Factors: Social connections, Concrete supports in place (healthy food, safe environments, etc.), Sense of purpose, and Physical Health (exercise, healthy diet,  medication compliance, etc.)  Goals Addressed: Patient will:  Reduce symptoms of: anxiety and stress   Increase knowledge and/or ability of: healthy habits and stress reduction   Demonstrate ability to: Increase healthy adjustment to current life circumstances  Progress towards Goals: Ongoing  Interventions: Interventions utilized:  Psychoeducation and/or Health Education, Link to [employer], and Supportive Reflection Standardized Assessments completed: Not Needed  Patient and/or Family Response: Patient agrees with treatment plan.   Assessment: Patient currently experiencing Adjustment disorder with anxious mood  and Psychosocial stress.   Patient may benefit from continued therapeutic interventions .  Plan: Follow up with behavioral health clinician on : One month Behavioral recommendations:  -Continue taking prenatal vitamin daily as prescribed -Continue prioritizing healthy self-care, including regular meals, adequate sleep and positive outlook - Discuss pain management options with medical provider on 11/05/21 appointment -Register for childbirth education class of choice at [email or web address]  -Contact insurance to find any transportation options to and from medical appointments -Consider obtaining reduced-fare bus ID at [facility] bus hub (information on AVS)   Referral(s): Integrated Art gallery manager (In Clinic) and [employer]:  Transportation  I discussed the assessment and treatment plan with the patient and/or parent/guardian. They were provided an opportunity to ask questions and all were answered. They agreed with the plan and demonstrated an understanding of the instructions.   They were advised to call back or seek an in-person evaluation if the symptoms worsen or if the condition fails to improve as anticipated.  Rae Lips, LCSW     09/06/2021    8:59 AM  Depression screen PHQ 2/9  Decreased Interest 1  Down, Depressed,  Hopeless 0  PHQ - 2 Score 1  Altered sleeping 2  Tired, decreased energy 1  Change in appetite 3  Feeling bad or failure about yourself  0  Trouble concentrating 0  Moving slowly or fidgety/restless 0  Suicidal thoughts 0  PHQ-9 Score 7      09/06/2021    9:04 AM  GAD 7 : Generalized Anxiety Score  Nervous, Anxious, on Edge 0  Control/stop worrying 0  Worry too much - different things 0  Trouble relaxing 1  Restless 0  Easily annoyed or irritable 1  Afraid - awful might happen 0  Total GAD 7 Score 2

## 2021-10-03 ENCOUNTER — Ambulatory Visit (INDEPENDENT_AMBULATORY_CARE_PROVIDER_SITE_OTHER): Payer: Commercial Managed Care - HMO | Admitting: Clinical

## 2021-10-03 DIAGNOSIS — F4322 Adjustment disorder with anxiety: Secondary | ICD-10-CM | POA: Diagnosis not present

## 2021-10-03 DIAGNOSIS — Z658 Other specified problems related to psychosocial circumstances: Secondary | ICD-10-CM

## 2021-10-03 NOTE — Patient Instructions (Signed)
Center for [facility] at [facility] [address] [place], [place] [address] [phone] (main office) [phone] (Ross Bender's office)  Have questions or need help accessing your MyChart account? To contact the MyChart Help Desk, call [phone]/ [phone].  Transportation [employer] ([employer]) [address] [place], [place], [place] [address] [email or web address]     Fixed-route bus services, including regional fare cards for [employer], [place], [place], and [employer] buses.  Reduced fare bus ID's available for Medicaid, Medicare, and "orange card" recipients.  [employer] offers curb-to-curb and door-to-door bus services for people with disabilities who are unable to use a fixed-bus route; also offers a shared-ride program.   Helpful tips:  -Routes available online and physical maps available at the main bus hub lobby (each for a specific route) -Smartphone directions often include bus routes (see the "bus" icon, next to the "car" and "walk" icons) -Routes differ on weekends, evenings and holidays, so plan ahead!  -If you have Medicaid, Medicare, or orange card, plan to obtain a reduced-fare ID to save 50% on rides. Check days and times to obtain an ID, and bring all necessary documents.

## 2021-10-10 ENCOUNTER — Encounter: Payer: Self-pay | Admitting: *Deleted

## 2021-10-10 ENCOUNTER — Ambulatory Visit: Payer: Commercial Managed Care - HMO | Admitting: *Deleted

## 2021-10-10 ENCOUNTER — Ambulatory Visit: Payer: Commercial Managed Care - HMO | Attending: Maternal & Fetal Medicine

## 2021-10-10 ENCOUNTER — Telehealth: Payer: Commercial Managed Care - HMO | Admitting: Physician Assistant

## 2021-10-10 ENCOUNTER — Other Ambulatory Visit: Payer: Self-pay | Admitting: Maternal & Fetal Medicine

## 2021-10-10 VITALS — BP 108/66 | HR 81

## 2021-10-10 DIAGNOSIS — Z3A25 25 weeks gestation of pregnancy: Secondary | ICD-10-CM

## 2021-10-10 DIAGNOSIS — Z3492 Encounter for supervision of normal pregnancy, unspecified, second trimester: Secondary | ICD-10-CM

## 2021-10-10 DIAGNOSIS — O365920 Maternal care for other known or suspected poor fetal growth, second trimester, not applicable or unspecified: Secondary | ICD-10-CM | POA: Diagnosis not present

## 2021-10-10 DIAGNOSIS — Z362 Encounter for other antenatal screening follow-up: Secondary | ICD-10-CM | POA: Insufficient documentation

## 2021-10-10 DIAGNOSIS — O0932 Supervision of pregnancy with insufficient antenatal care, second trimester: Secondary | ICD-10-CM

## 2021-10-10 DIAGNOSIS — Z3A24 24 weeks gestation of pregnancy: Secondary | ICD-10-CM | POA: Insufficient documentation

## 2021-10-10 DIAGNOSIS — O0930 Supervision of pregnancy with insufficient antenatal care, unspecified trimester: Secondary | ICD-10-CM | POA: Diagnosis not present

## 2021-10-10 NOTE — Progress Notes (Signed)
Patient needing to schedule genetic testing lab work prior to seeing new OB/GYN on 11/05/21. Advised for her to call the office of her OB/GYN, number provided, for them to schedule this lab work for her.

## 2021-10-11 ENCOUNTER — Other Ambulatory Visit: Payer: Self-pay | Admitting: *Deleted

## 2021-10-11 DIAGNOSIS — Z3689 Encounter for other specified antenatal screening: Secondary | ICD-10-CM

## 2021-10-16 ENCOUNTER — Other Ambulatory Visit: Payer: Self-pay

## 2021-10-16 DIAGNOSIS — Z3490 Encounter for supervision of normal pregnancy, unspecified, unspecified trimester: Secondary | ICD-10-CM

## 2021-10-17 ENCOUNTER — Other Ambulatory Visit: Payer: Commercial Managed Care - HMO

## 2021-10-17 ENCOUNTER — Other Ambulatory Visit: Payer: Self-pay

## 2021-10-17 NOTE — BH Specialist Note (Signed)
Pt did not arrive to video visit and did not answer the phone; unable to leave voice message; left MyChart message for patient.

## 2021-10-18 LAB — CBC
Hematocrit: 36.3 % (ref 34.0–46.6)
Hemoglobin: 12.1 g/dL (ref 11.1–15.9)
MCH: 28.5 pg (ref 26.6–33.0)
MCHC: 33.3 g/dL (ref 31.5–35.7)
MCV: 86 fL (ref 79–97)
Platelets: 208 10*3/uL (ref 150–450)
RBC: 4.24 x10E6/uL (ref 3.77–5.28)
RDW: 12.3 % (ref 11.7–15.4)
WBC: 7.6 10*3/uL (ref 3.4–10.8)

## 2021-10-18 LAB — RPR: RPR Ser Ql: NONREACTIVE

## 2021-10-18 LAB — HEPATITIS C ANTIBODY: Hep C Virus Ab: NONREACTIVE

## 2021-10-18 LAB — RUBELLA ANTIBODY, IGM: Rubella IgM: <20 AU/mL (ref 0.0–19.9)

## 2021-10-18 LAB — HIV ANTIBODY (ROUTINE TESTING W REFLEX): HIV Screen 4th Generation wRfx: NONREACTIVE

## 2021-10-18 LAB — ANTIBODY SCREEN: Antibody Screen: NEGATIVE

## 2021-10-18 LAB — HEPATITIS B SURFACE ANTIGEN: Hepatitis B Surface Ag: NEGATIVE

## 2021-10-20 ENCOUNTER — Encounter: Payer: Self-pay | Admitting: Advanced Practice Midwife

## 2021-10-20 DIAGNOSIS — Z2839 Other underimmunization status: Secondary | ICD-10-CM | POA: Insufficient documentation

## 2021-10-24 ENCOUNTER — Other Ambulatory Visit: Payer: Self-pay

## 2021-10-24 ENCOUNTER — Other Ambulatory Visit: Payer: Commercial Managed Care - HMO

## 2021-10-24 VITALS — Wt 132.3 lb

## 2021-10-24 DIAGNOSIS — Z3490 Encounter for supervision of normal pregnancy, unspecified, unspecified trimester: Secondary | ICD-10-CM

## 2021-10-25 LAB — GLUCOSE TOLERANCE, 2 HOURS W/ 1HR
Glucose, 1 hour: 126 mg/dL (ref 70–179)
Glucose, 2 hour: 109 mg/dL (ref 70–152)
Glucose, Fasting: 78 mg/dL (ref 70–91)

## 2021-10-30 LAB — PANORAMA PRENATAL TEST FULL PANEL:PANORAMA TEST PLUS 5 ADDITIONAL MICRODELETIONS: FETAL FRACTION: 18.4

## 2021-10-31 ENCOUNTER — Ambulatory Visit: Payer: Commercial Managed Care - HMO | Admitting: Clinical

## 2021-10-31 DIAGNOSIS — Z91199 Patient's noncompliance with other medical treatment and regimen due to unspecified reason: Secondary | ICD-10-CM

## 2021-11-03 LAB — HORIZON CUSTOM: REPORT SUMMARY: NEGATIVE

## 2021-11-05 ENCOUNTER — Telehealth: Payer: Self-pay | Admitting: Family Medicine

## 2021-11-05 ENCOUNTER — Telehealth: Payer: Self-pay | Admitting: Advanced Practice Midwife

## 2021-11-05 ENCOUNTER — Encounter: Payer: Commercial Managed Care - HMO | Admitting: Advanced Practice Midwife

## 2021-11-05 NOTE — Telephone Encounter (Signed)
Call placed back to pt. Spoke with pt. Pt states would like the test results from 8/9 visit. Pt given normal/negative results per chart. Pt verbalized understanding. Pt states has transferred to [facility] and needing records sent to new OB office. Advised will send front office message to send records once they receive a ROI. Pt verbalized understanding.  Ioma Chismar,RNC

## 2021-11-05 NOTE — Telephone Encounter (Signed)
Patient said she got some test done a few weeks ago, calling to get her results.

## 2021-11-05 NOTE — Telephone Encounter (Signed)
Patient called [facility] front desk to cancel this morning's New OB appointment. She was offered my 1615 appointment (virtual or in-person) and declined. Patient states she is out of town and cannot attend. She was rescheduled for New OB on 11/21/21.  Clayton Bibles, MSA, MSN, CNM Certified Nurse Midwife, Biochemist, clinical for [employer], [facility]

## 2021-11-21 ENCOUNTER — Encounter: Payer: Commercial Managed Care - HMO | Admitting: Certified Nurse Midwife

## 2021-12-05 ENCOUNTER — Encounter: Payer: Self-pay | Admitting: Physician Assistant

## 2021-12-05 ENCOUNTER — Ambulatory Visit: Payer: Commercial Managed Care - HMO

## 2022-01-18 ENCOUNTER — Inpatient Hospital Stay (HOSPITAL_COMMUNITY): Admit: 2022-01-18 | Payer: Self-pay

## 2022-02-28 ENCOUNTER — Encounter: Payer: Self-pay | Admitting: *Deleted

## 2023-01-27 NOTE — Telephone Encounter (Signed)
Open in error

## 2023-12-20 ENCOUNTER — Encounter (HOSPITAL_COMMUNITY): Payer: Self-pay

## 2023-12-20 ENCOUNTER — Other Ambulatory Visit: Payer: Self-pay

## 2023-12-20 ENCOUNTER — Emergency Department (HOSPITAL_COMMUNITY)
Admission: EM | Admit: 2023-12-20 | Discharge: 2023-12-20 | Disposition: A | Discharge status: Home / Self Care | Attending: Emergency Medicine | Admitting: Emergency Medicine

## 2023-12-20 ENCOUNTER — Emergency Department (HOSPITAL_COMMUNITY)

## 2023-12-20 DIAGNOSIS — Y92410 Unspecified street and highway as the place of occurrence of the external cause: Secondary | ICD-10-CM | POA: Insufficient documentation

## 2023-12-20 DIAGNOSIS — R519 Headache, unspecified: Secondary | ICD-10-CM | POA: Diagnosis not present

## 2023-12-20 DIAGNOSIS — M542 Cervicalgia: Secondary | ICD-10-CM | POA: Diagnosis present

## 2023-12-20 DIAGNOSIS — M546 Pain in thoracic spine: Secondary | ICD-10-CM | POA: Diagnosis not present

## 2023-12-20 DIAGNOSIS — S161XXA Strain of muscle, fascia and tendon at neck level, initial encounter: Secondary | ICD-10-CM | POA: Insufficient documentation

## 2023-12-20 DIAGNOSIS — M545 Low back pain, unspecified: Secondary | ICD-10-CM | POA: Insufficient documentation

## 2023-12-20 DIAGNOSIS — R079 Chest pain, unspecified: Secondary | ICD-10-CM | POA: Diagnosis not present

## 2023-12-20 LAB — POC URINE PREG, ED: Preg Test, Ur: NEGATIVE

## 2023-12-20 MED ORDER — ACETAMINOPHEN 325 MG PO TABS: 650.0000 mg | ORAL_TABLET | Freq: Four times a day (QID) | ORAL | 0 refills | Status: AC | PRN

## 2023-12-20 MED ORDER — KETOROLAC TROMETHAMINE 30 MG/ML IJ SOLN
30.0000 mg | Freq: Once | INTRAMUSCULAR | Status: AC
  Administered 2023-12-20: 30 mg via INTRAMUSCULAR
  Filled 2023-12-20: qty 1

## 2023-12-20 MED ORDER — OXYCODONE HCL 5 MG PO TABS: 5.0000 mg | ORAL_TABLET | Freq: Three times a day (TID) | ORAL | 0 refills | Status: AC | PRN

## 2023-12-20 MED ORDER — OXYCODONE HCL 5 MG PO TABS
5.0000 mg | ORAL_TABLET | Freq: Once | ORAL | Status: AC
  Administered 2023-12-20: 5 mg via ORAL
  Filled 2023-12-20: qty 1

## 2023-12-20 MED ORDER — IBUPROFEN 600 MG PO TABS: 600.0000 mg | ORAL_TABLET | Freq: Four times a day (QID) | ORAL | 0 refills | Status: AC | PRN

## 2023-12-20 MED ORDER — ACETAMINOPHEN 325 MG PO TABS
650.0000 mg | ORAL_TABLET | Freq: Four times a day (QID) | ORAL | Status: AC | PRN
  Administered 2023-12-20: 650 mg via ORAL
  Filled 2023-12-20: qty 2
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
